# Patient Record
Sex: Male | Born: 1980 | Race: White | Hispanic: No | Marital: Single | Smoking: Never smoker
Health system: Southern US, Community
[De-identification: ages and names within clinical notes are randomized; demographics above are authoritative.]

## PROBLEM LIST (undated history)

## (undated) DIAGNOSIS — J189 Pneumonia, unspecified organism: Secondary | ICD-10-CM

## (undated) HISTORY — PX: WISDOM TOOTH EXTRACTION: SHX21

## (undated) HISTORY — DX: Pneumonia, unspecified organism: J18.9

---
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
## 1999-03-21 HISTORY — PX: WRIST SURGERY: SHX841

## 2011-04-22 ENCOUNTER — Encounter: Payer: Self-pay | Admitting: Emergency Medicine

## 2011-04-22 ENCOUNTER — Emergency Department
Admission: EM | Admit: 2011-04-22 | Discharge: 2011-04-22 | Disposition: A | Discharge status: Home / Self Care | Payer: BC Managed Care – PPO | Source: Home / Self Care | Attending: Family Medicine | Admitting: Family Medicine

## 2011-04-22 DIAGNOSIS — J069 Acute upper respiratory infection, unspecified: Secondary | ICD-10-CM

## 2011-04-22 MED ORDER — AZITHROMYCIN 250 MG PO TABS: ORAL_TABLET | ORAL | Status: AC

## 2011-04-22 NOTE — ED Provider Notes (Signed)
History     CSN: [id number]  Arrival date & time 04/22/11  1504   First MD Initiated Contact with Patient 04/22/11 1631      Chief Complaint  Patient presents with  . Nasal Congestion     HPI Comments: Patient complains of approximately 8 day history of gradually progressive URI symptoms beginning with a mild sore throat (now resolved), followed by progressive nasal congestion.  A cough started about 4 days ago, now resolved.  Complains of fatigue and initial myalgias, resolved.    There has been no pleuritic pain, shortness of breath, or wheezes.  He still has mild nasal congestion and facial pressure.  No recent fevers, chills, and sweats   The history is provided by the patient.    History reviewed. No pertinent past medical history.  Past Surgical History  Procedure Date  . Tonsillectomy   . Appendectomy   . Arthroscopic repair acl     History reviewed. No pertinent family history.  History  Substance Use Topics  . Smoking status: Never Smoker   . Smokeless tobacco: Not on file  . Alcohol Use: Yes      Review of Systems + sore throat resolved + cough, resolved No pleuritic pain No wheezing + nasal congestion + post-nasal drainage ? sinus pain/pressure No itchy/red eyes No earache No hemoptysis No SOB No fever/chills No nausea No vomiting No abdominal pain No diarrhea No urinary symptoms No skin rashes No fatigue No myalgias + headache Used OTC meds without relief  Allergies  Review of patient's allergies indicates no known allergies.  Home Medications   Current Outpatient Rx  Name Route Sig Dispense Refill  . AZITHROMYCIN 250 MG PO TABS  Take 2 tabs today; then begin one tab once daily for 4 more days (Rx void after 04/30/11) 6 each 0    BP 123/80  Pulse 68  Temp 98.8 F (37.1 C)  Resp 18  Ht 5\' 9"  (1.753 m)  Wt 201 lb (91.173 kg)  BMI 29.68 kg/m2  SpO2 98%  Physical Exam Nursing notes and Vital Signs reviewed. Appearance:  Patient  appears healthy, stated age, and in no acute distress Eyes:  Pupils are equal, round, and reactive to light and accomodation.  Extraocular movement is intact.  Conjunctivae are not inflamed  Ears:  Canals normal.  Tympanic membranes normal.  Nose:  Mildly congested turbinates.  No sinus tenderness.     Pharynx:  Normal Neck:  Supple.  Slightly tender shotty posterior nodes are palpated bilaterally  Lungs:  Clear to auscultation.  Breath sounds are equal.  Chest:  Mild tenderness to palpation over the inferior sternum.  Heart:  Regular rate and rhythm without murmurs, rubs, or gallops.  Abdomen:  Nontender without masses or hepatosplenomegaly.  Bowel sounds are present.  No CVA or flank tenderness.  Skin:  No rash present.   ED Course  Procedures none      1. Acute upper respiratory infections of unspecified site       MDM  There is no evidence of bacterial infection today.   Treat symptomatically for now  Take Mucinex D (guaifenesin with decongestant) twice daily for congestion.  Increase fluid intake, rest. May use Afrin nasal spray (or generic oxymetazoline) twice daily for about 5 days.  Also recommend using saline nasal spray several times daily and saline nasal irrigation (AYR is a common brand) Stop all antihistamines for now, and other non-prescription cough/cold preparations. May take Ibuprofen 200mg , 4 tabs every 8  hours with food for chest/sternum discomfort. Begin Azithromycin if not improving about 5 days or if persistent fever develops (given Rx to hold) Follow-up with family doctor if not improving 7 to 10 days.        Donna Christen, MD 04/22/11 [phone]

## 2011-04-22 NOTE — ED Notes (Addendum)
Nasal/sinus congestion, ear pain, rib discomfort, headaches x 8 days. Hx of sinus infx. and this feels similar. Did have Flu vaccination this season.

## 2011-04-26 LAB — CBC AND DIFFERENTIAL
HEMOGLOBIN: 16 g/dL (ref 13.5–17.5)
PLATELETS: 340 10*3/uL (ref 150–399)
WBC: 4.2 10^3/mL

## 2011-04-26 LAB — LIPID PANEL
CHOLESTEROL: 202 mg/dL — AB (ref 0–200)
HDL: 48 mg/dL (ref 35–70)
LDL CALC: 131 mg/dL
TRIGLYCERIDES: 115 mg/dL (ref 40–160)

## 2011-04-26 LAB — HEPATIC FUNCTION PANEL
ALK PHOS: 64 U/L (ref 25–125)
ALT: 24 U/L (ref 10–40)
AST: 15 U/L (ref 14–40)

## 2011-04-26 LAB — BASIC METABOLIC PANEL
CREATININE: 0.9 mg/dL (ref 0.6–1.3)
GLUCOSE: 73 mg/dL
Potassium: 4.6 mmol/L (ref 3.4–5.3)
Sodium: 136 mmol/L — AB (ref 137–147)

## 2011-04-26 LAB — TSH: TSH: 1.4 u[IU]/mL (ref 0.41–5.90)

## 2012-04-26 ENCOUNTER — Emergency Department (INDEPENDENT_AMBULATORY_CARE_PROVIDER_SITE_OTHER): Payer: BC Managed Care – PPO

## 2012-04-26 ENCOUNTER — Encounter: Payer: Self-pay | Admitting: *Deleted

## 2012-04-26 ENCOUNTER — Emergency Department (INDEPENDENT_AMBULATORY_CARE_PROVIDER_SITE_OTHER)
Admission: EM | Admit: 2012-04-26 | Discharge: 2012-04-26 | Disposition: A | Discharge status: Home / Self Care | Payer: BC Managed Care – PPO | Source: Home / Self Care | Attending: Family Medicine | Admitting: Family Medicine

## 2012-04-26 DIAGNOSIS — R05 Cough: Secondary | ICD-10-CM

## 2012-04-26 DIAGNOSIS — R011 Cardiac murmur, unspecified: Secondary | ICD-10-CM

## 2012-04-26 DIAGNOSIS — R0602 Shortness of breath: Secondary | ICD-10-CM

## 2012-04-26 DIAGNOSIS — R059 Cough, unspecified: Secondary | ICD-10-CM

## 2012-04-26 DIAGNOSIS — R918 Other nonspecific abnormal finding of lung field: Secondary | ICD-10-CM

## 2012-04-26 DIAGNOSIS — R062 Wheezing: Secondary | ICD-10-CM

## 2012-04-26 DIAGNOSIS — J189 Pneumonia, unspecified organism: Secondary | ICD-10-CM

## 2012-04-26 DIAGNOSIS — R509 Fever, unspecified: Secondary | ICD-10-CM

## 2012-04-26 LAB — POCT INFLUENZA A/B: Influenza B, POC: NEGATIVE

## 2012-04-26 MED ORDER — AZITHROMYCIN 250 MG PO TABS: ORAL_TABLET | ORAL | Status: DC

## 2012-04-26 MED ORDER — CEFTRIAXONE SODIUM 1 G IJ SOLR
1.0000 g | INTRAMUSCULAR | Status: DC
  Administered 2012-04-26: 1 g via INTRAMUSCULAR

## 2012-04-26 MED ORDER — METHYLPREDNISOLONE ACETATE 40 MG/ML IJ SUSP
80.0000 mg | Freq: Once | INTRAMUSCULAR | Status: AC
  Administered 2012-04-26: 80 mg via INTRAMUSCULAR

## 2012-04-26 MED ORDER — CEFTRIAXONE SODIUM 1 G IJ SOLR
1.0000 g | Freq: Once | INTRAMUSCULAR | Status: AC
  Administered 2012-04-26: 1 g via INTRAMUSCULAR

## 2012-04-26 NOTE — ED Notes (Signed)
Patient c/o body aches and chills x 2 days. Yesterday he developed productive cough, having coughing "fits" that cause SOB. No hx of lung disease.

## 2012-04-26 NOTE — ED Provider Notes (Signed)
History     CSN: [id number]  Arrival date & time 04/26/12  1617   First MD Initiated Contact with Patient 04/26/12 1653      Chief Complaint  Patient presents with  . Chills  . Cough   HPI  URI Symptoms Onset: 2 days  Description: cough, mild SOB, generalized malaise, fever, rhinorrhea, nasal congestion  Modifying factors:  none  Symptoms Nasal discharge: yes Fever: yes Sore throat: no Cough: yes Wheezing: yes Ear pain: no GI symptoms: no Sick contacts: yes  Red Flags  Stiff neck: no Dyspnea: mild Rash: no Swallowing difficulty: no  Sinusitis Risk Factors Headache/face pain: no Double sickening: no tooth pain: no  Allergy Risk Factors Sneezing: no Itchy scratchy throat: no Seasonal symptoms: no  Flu Risk Factors Headache: no muscle aches: yes severe fatigue: yes   History reviewed. No pertinent past medical history.  Past Surgical History  Procedure Date  . Tonsillectomy   . Appendectomy   . Arthroscopic repair acl     History reviewed. No pertinent family history.  History  Substance Use Topics  . Smoking status: Never Smoker   . Smokeless tobacco: Not on file  . Alcohol Use: Yes      Review of Systems  All other systems reviewed and are negative.    Allergies  Molds & smuts  Home Medications  No current outpatient prescriptions on file.  BP 141/81  Pulse 97  Temp 99 F (37.2 C) (Oral)  Resp 14  Ht 5\' 9"  (1.753 m)  Wt 196 lb (88.905 kg)  BMI 28.94 kg/m2  SpO2 96%  Physical Exam  Constitutional: He appears well-developed and well-nourished.  HENT:  Head: Normocephalic and atraumatic.       +nasal erythema, rhinorrhea bilaterally, + post oropharyngeal erythema    Eyes: Conjunctivae normal are normal. Pupils are equal, round, and reactive to light.  Neck: Normal range of motion. Neck supple.  Cardiovascular: Normal rate and regular rhythm.        + II-III/VI systolic murmur most prominent around cardiac outflow tract    Pulmonary/Chest: Effort normal.       Faint wheezes in bases    Abdominal: Soft.  Musculoskeletal: Normal range of motion.  Neurological: He is alert.  Skin: Skin is warm.    ED Course  Procedures (including critical care time)   Labs Reviewed  POCT INFLUENZA A/B   Dg Chest 2 View  04/26/2012  *RADIOLOGY REPORT*  Clinical Data: Cough, shortness of breath, fever  CHEST - 2 VIEW  Comparison: None.  Findings: There is a patchy opacity within the right middle lung field probably within the right upper lobe suspicious for pneumonia.  The remainder of the lungs are clear.  No effusion is seen.  No skeletal abnormality is seen.  IMPRESSION: Patchy opacity in the region of the posterior right upper lobe most consistent with pneumonia.  Recommend follow-up.   Original Report Authenticated By: Dwyane Dee, M.D.      1. Pneumonia   2. Wheezing   3. Systolic murmur    EKG: normal EKG, normal sinus rhythm.    MDM  Noted pneumonia on x-ray. Will treat with azithromycin. 1 g of Rocephin IM x1 here in clinic. Depo-Medrol 80 mg IM x1 for wheezing. EKG was normal today in the setting of systolic murmur. Will formally refer patient to cardiology for further evaluation of this. Patient denies any prior history of murmur in the past. Currently asymptomatic apart from pulmonary issues related to  pneumonia. Discussed general and cardiopulmonary red flags. Followup with PCP in the next 5-7 days for reevaluation of symptoms.  `  The patient and/or caregiver has been counseled thoroughly with regard to treatment plan and/or medications prescribed including dosage, schedule, interactions, rationale for use, and possible side effects and they verbalize understanding. Diagnoses and expected course of recovery discussed and will return if not improved as expected or if the condition worsens. Patient and/or caregiver verbalized understanding.             Doree Albee, MD 04/26/12 1806

## 2012-04-29 ENCOUNTER — Telehealth: Payer: Self-pay | Admitting: *Deleted

## 2012-04-30 ENCOUNTER — Encounter: Payer: Self-pay | Admitting: Cardiology

## 2012-04-30 ENCOUNTER — Encounter: Payer: Self-pay | Admitting: *Deleted

## 2012-05-01 ENCOUNTER — Encounter: Payer: Self-pay | Admitting: Cardiology

## 2012-05-01 ENCOUNTER — Ambulatory Visit (INDEPENDENT_AMBULATORY_CARE_PROVIDER_SITE_OTHER): Payer: BC Managed Care – PPO | Admitting: Cardiology

## 2012-05-01 VITALS — BP 130/84 | HR 90 | Wt 199.0 lb

## 2012-05-01 DIAGNOSIS — J189 Pneumonia, unspecified organism: Secondary | ICD-10-CM

## 2012-05-01 DIAGNOSIS — R011 Cardiac murmur, unspecified: Secondary | ICD-10-CM

## 2012-05-01 NOTE — Assessment & Plan Note (Signed)
I do not hear a murmur on examination. His PMI is not displaced and there is no right ventricular heave. There is no change with Valsalva. I will not pursue further cardiac evaluation at this point. Previous murmur may have been related to an ejection murmur with his pneumonia and probable tachycardia.

## 2012-05-01 NOTE — Patient Instructions (Addendum)
Your physician recommends that you schedule a follow-up appointment in: AS NEEDED  

## 2012-05-01 NOTE — Progress Notes (Signed)
  HPI: 32 year old male for evaluation of murmur. Patient recently seen for pneumonia. During that evaluation he was felt to have a murmur and cardiology was asked to evaluate. He denies dyspnea, chest pain, palpitations or syncope. His pneumonia symptoms have resolved.  Current Outpatient Prescriptions  Medication Sig Dispense Refill  . aspirin 81 MG tablet Take 81 mg by mouth as needed for pain.       No current facility-administered medications for this visit.    Allergies  Allergen Reactions  . Molds & Smuts     Past Medical History  Diagnosis Date  . Pneumonia     Past Surgical History  Procedure Laterality Date  . Tonsillectomy    . Appendectomy    . Arthroscopic repair acl    . Wrist surgery      History   Social History  . Marital Status: Single    Spouse Name: N/A    Number of Children: N/A  . Years of Education: N/A   Occupational History  . Not on file.   Social History Main Topics  . Smoking status: Never Smoker   . Smokeless tobacco: Not on file  . Alcohol Use: Yes     Comment: Occasion  . Drug Use: No  . Sexually Active: Not on file   Other Topics Concern  . Not on file   Social History Narrative  . No narrative on file    Family History  Problem Relation Age of Onset  . Heart disease      No history in immediate family    ROS: no fevers or chills, productive cough, hemoptysis, dysphasia, odynophagia, melena, hematochezia, dysuria, hematuria, rash, seizure activity, orthopnea, PND, pedal edema, claudication. Remaining systems are negative.  Physical Exam:   Blood pressure 130/84, pulse 90, weight 199 lb (90.266 kg).  General:  Well developed/well nourished in NAD Skin warm/dry Patient not depressed No peripheral clubbing Back-normal HEENT-normal/normal eyelids Neck supple/normal carotid upstroke bilaterally; no bruits; no JVD; no thyromegaly chest - CTA/ normal expansion CV - RRR/normal S1 and S2; no murmurs, rubs or gallops;   PMI nondisplaced Abdomen -NT/ND, no HSM, no mass, + bowel sounds, no bruit 2+ femoral pulses, no bruits Ext-no edema, chords, 2+ DP Neuro-grossly nonfocal  ECG 04/26/2012-sinus rhythm, RV conduction delay.

## 2012-05-01 NOTE — Assessment & Plan Note (Signed)
Symptoms improved. Followup primary care.

## 2013-05-19 ENCOUNTER — Ambulatory Visit (INDEPENDENT_AMBULATORY_CARE_PROVIDER_SITE_OTHER): Payer: BC Managed Care – PPO | Admitting: Family Medicine

## 2013-05-19 ENCOUNTER — Encounter: Payer: Self-pay | Admitting: Family Medicine

## 2013-05-19 VITALS — BP 129/84 | HR 68 | Ht 69.0 in | Wt 203.0 lb

## 2013-05-19 DIAGNOSIS — R0982 Postnasal drip: Secondary | ICD-10-CM

## 2013-05-19 DIAGNOSIS — Z113 Encounter for screening for infections with a predominantly sexual mode of transmission: Secondary | ICD-10-CM

## 2013-05-19 MED ORDER — MOMETASONE FUROATE 50 MCG/ACT NA SUSP: NASAL | Status: AC

## 2013-05-19 NOTE — Progress Notes (Signed)
CC: Kenneth Stokes is a 33 y.o. male is here for Establish Care and std screen   Subjective: HPI:  Very pleasant 33 year old here to establish care  Patient is requesting counseling on STD screening he has had multiple partners with unprotected sex in the past he is marrying a virgin wife in September of this year. He and she are expressing no concerns with symptoms related to STDs however want to make sure that Neither one of them have any contagious diseases. Specifically denies testicular pain, dysuria, penile discharge nor genital lesions recently or remotely.  Complains of 2 months of a nonproductive cough present all hours of the day but worse when lying down at night or when exposed to cold air. This was initially accompanied by nasal congestion and facial pressure however this is resolved with Sudafed many weeks ago.  Taking vitamin C supplements but no benefit.  Denies chest pain, shortness of breath, wheezing, orthopnea nor peripheral edema  Review of Systems - General ROS: negative for - chills, fever, night sweats, weight gain or weight loss Ophthalmic ROS: negative for - decreased vision Psychological ROS: negative for - anxiety or depression ENT ROS: negative for - hearing change, nasal congestion, tinnitus or allergies Hematological and Lymphatic ROS: negative for - bleeding problems, bruising or swollen lymph nodes Breast ROS: negative Respiratory ROS: no shortness of breath, or wheezing Cardiovascular ROS: no chest pain or dyspnea on exertion Gastrointestinal ROS: no abdominal pain, change in bowel habits, or black or bloody stools Genito-Urinary ROS: negative for - genital discharge, genital ulcers, incontinence or abnormal bleeding from genitals Musculoskeletal ROS: negative for - joint pain or muscle pain Neurological ROS: negative for - headaches or memory loss Dermatological ROS: negative for lumps, mole changes, rash and skin lesion changes  Past Medical History   Diagnosis Date  . Pneumonia     Past Surgical History  Procedure Laterality Date  . Tonsillectomy  1992  . Appendectomy  1998  . Arthroscopic repair acl  1997  . Wrist surgery  2001  . Wisdom tooth extraction     Family History  Problem Relation Age of Onset  . Heart disease      No history in immediate family    History   Social History  . Marital Status: Single    Spouse Name: N/A    Number of Children: N/A  . Years of Education: N/A   Occupational History  . Not on file.   Social History Main Topics  . Smoking status: Never Smoker   . Smokeless tobacco: Not on file  . Alcohol Use: Yes     Comment: Occasion  . Drug Use: No  . Sexual Activity: Yes    Partners: Female   Other Topics Concern  . Not on file   Social History Narrative  . No narrative on file     Objective: BP 129/84  Pulse 68  Ht 5\' 9"  (1.753 m)  Wt 203 lb (92.08 kg)  BMI 29.96 kg/m2  General: Alert and Oriented, No Acute Distress HEENT: Pupils equal, round, reactive to light. Conjunctivae clear.  External ears unremarkable, canals clear with intact TMs with appropriate landmarks.  Middle ear appears open without effusion. Pink inferior turbinates.  Moist mucous membranes, pharynx without inflammation nor lesions however moderate cobblestoning.  Neck supple without palpable lymphadenopathy nor abnormal masses. Lungs: Clear to auscultation bilaterally, no wheezing/ronchi/rales.  Comfortable work of breathing. Good air movement. Cardiac: Regular rate and rhythm. Normal S1/S2.  No murmurs,  rubs, nor gallops.   Mental Status: No depression, anxiety, nor agitation. Skin: Warm and dry.  Assessment & Plan: Kenneth Stokes was seen today for establish care and std screen.  Diagnoses and associated orders for this visit:  Screening for STD (sexually transmitted disease) - HIV antibody - RPR - GC/chlamydia probe amp, urine - Hepatitis B surface antigen - Hepatitis C antibody - HSV 1 antibody,  IgG - HSV 2 antibody, IgG  Post-nasal drip - mometasone (NASONEX) 50 MCG/ACT nasal spray; Two sprays each nostril daily.    STD screening: Checking above labs Postnasal drip: Suspect this is causing his persistent cough therefore start Nasonex if cheaper he could also consider using Nasacort over-the-counter   Return if symptoms worsen or fail to improve.

## 2013-05-20 ENCOUNTER — Encounter: Payer: Self-pay | Admitting: Family Medicine

## 2013-05-20 DIAGNOSIS — B009 Herpesviral infection, unspecified: Secondary | ICD-10-CM | POA: Insufficient documentation

## 2013-05-20 LAB — HEPATITIS C ANTIBODY: HCV AB: NEGATIVE

## 2013-05-20 LAB — GC/CHLAMYDIA PROBE AMP, URINE
Chlamydia, Swab/Urine, PCR: NEGATIVE
GC Probe Amp, Urine: NEGATIVE

## 2013-05-20 LAB — HSV 2 ANTIBODY, IGG: HSV 2 GLYCOPROTEIN G AB, IGG: 0.75 IV

## 2013-05-20 LAB — RPR

## 2013-05-20 LAB — HIV ANTIBODY (ROUTINE TESTING W REFLEX): HIV: NONREACTIVE

## 2013-05-20 LAB — HSV 1 ANTIBODY, IGG: HSV 1 Glycoprotein G Ab, IgG: 7.06 IV — ABNORMAL HIGH

## 2013-05-20 LAB — HEPATITIS B SURFACE ANTIGEN: HEP B S AG: NEGATIVE

## 2013-06-05 ENCOUNTER — Encounter: Payer: Self-pay | Admitting: Family Medicine

## 2013-06-05 DIAGNOSIS — M75101 Unspecified rotator cuff tear or rupture of right shoulder, not specified as traumatic: Secondary | ICD-10-CM | POA: Insufficient documentation

## 2013-06-05 DIAGNOSIS — E785 Hyperlipidemia, unspecified: Secondary | ICD-10-CM | POA: Insufficient documentation

## 2013-06-10 ENCOUNTER — Encounter: Payer: Self-pay | Admitting: *Deleted

## 2014-02-21 IMAGING — CR DG CHEST 2V
2 series · 2 of 2 positions shown · non-contrast
Comparison: None.

CLINICAL DATA: Cough, shortness of breath, fever

CHEST - 2 VIEW

[view not recorded (1 of 2)]
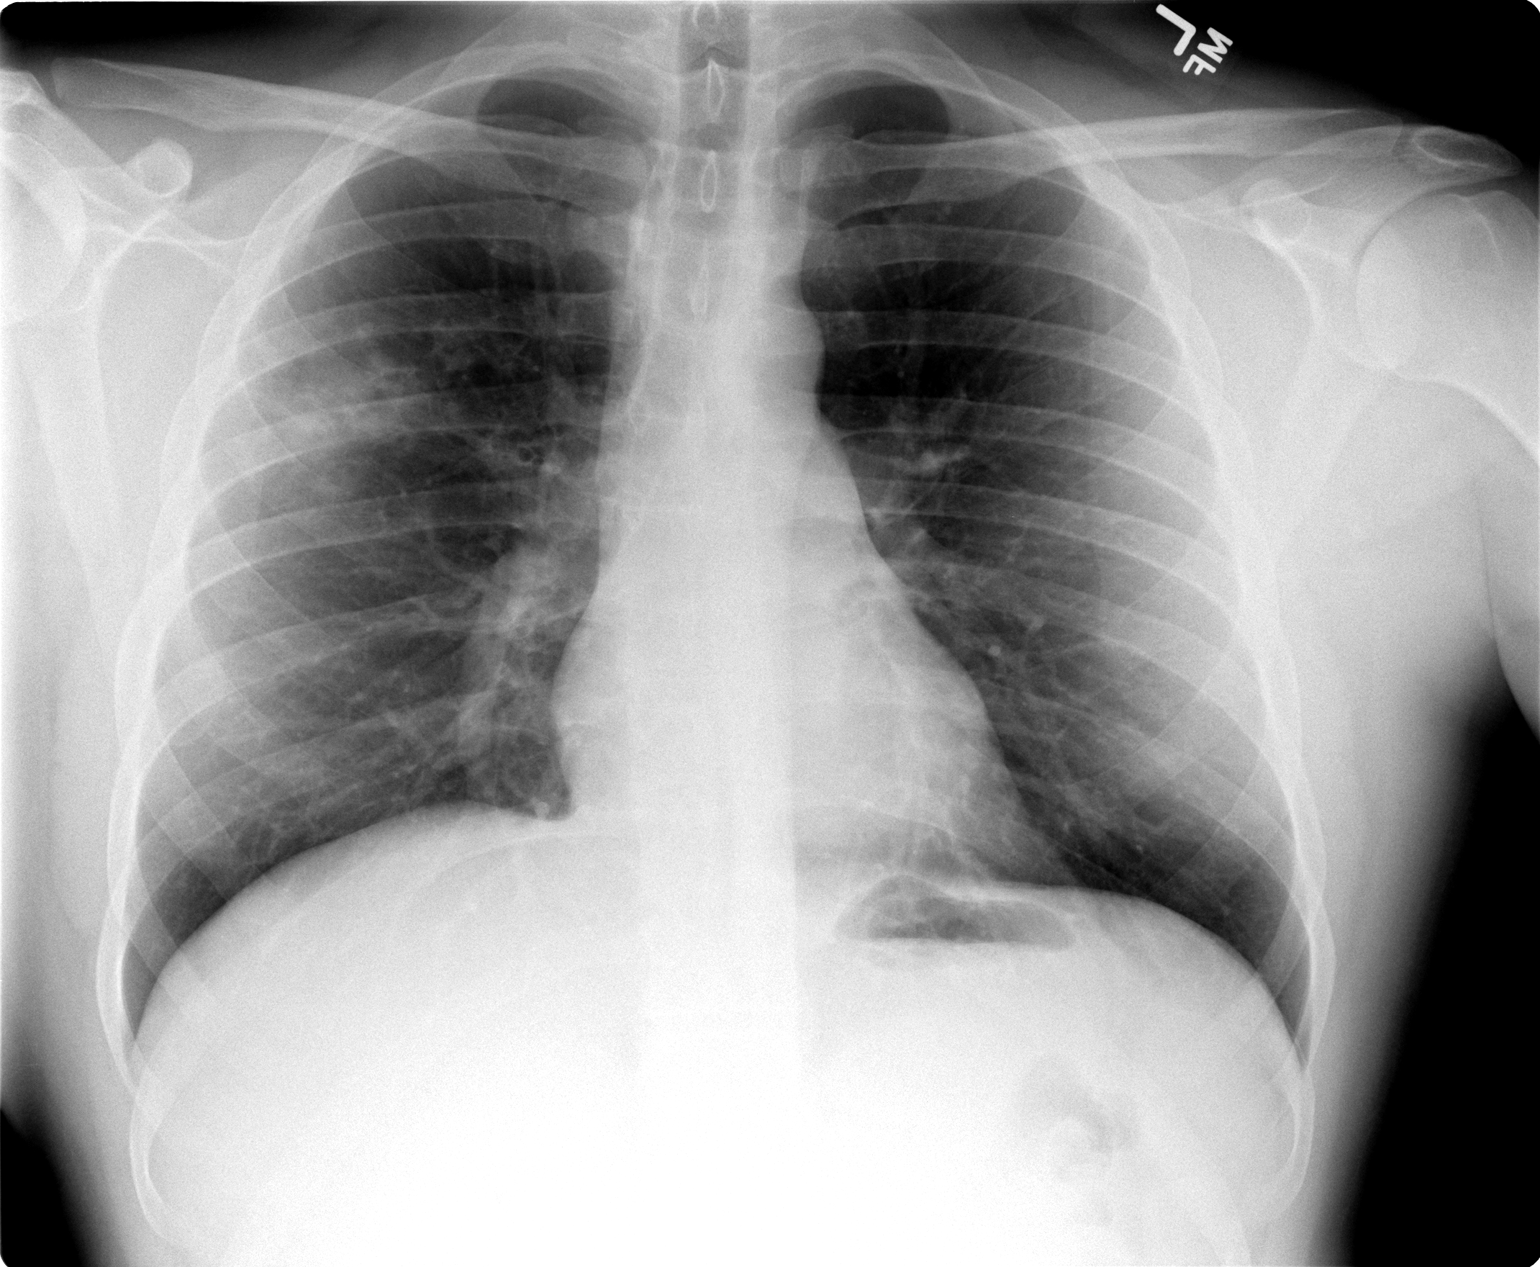

[view not recorded (2 of 2)]
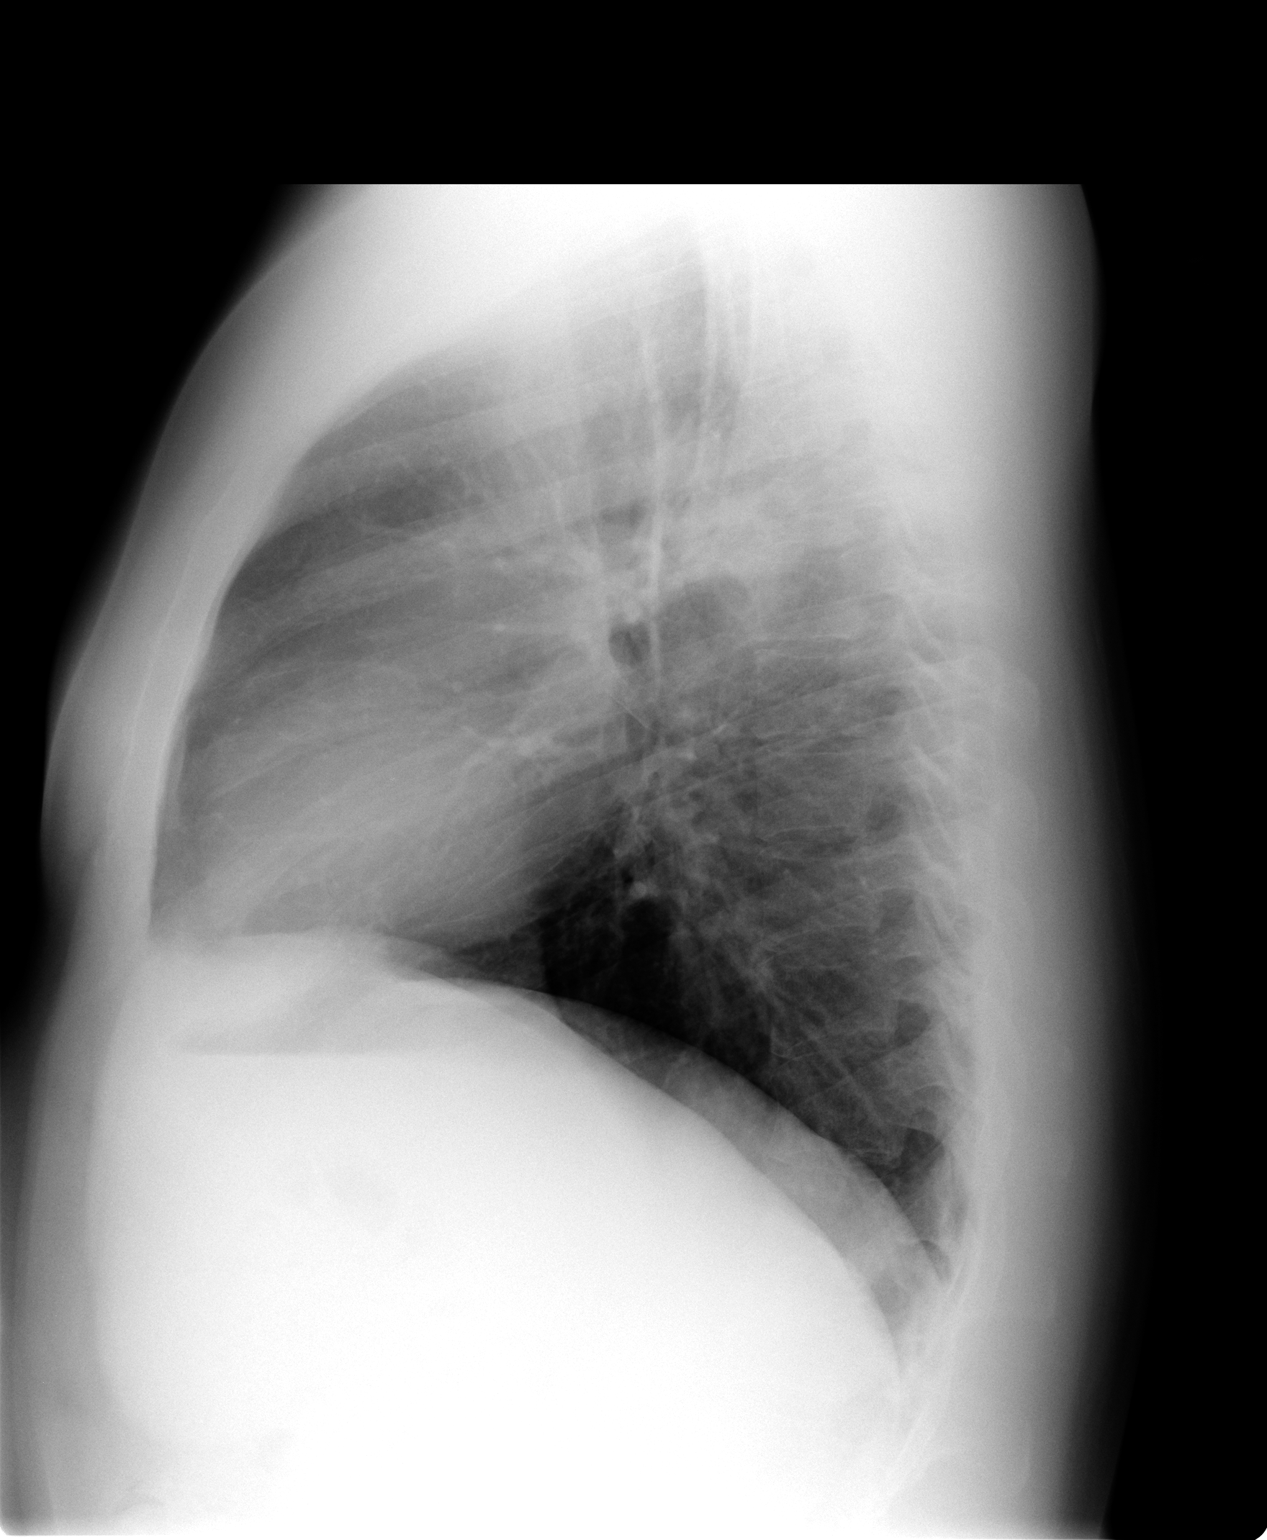

[2 of 2 positions shown; findings below may reference images not displayed]

FINDINGS: There is a patchy opacity within the right middle lung
field probably within the right upper lobe suspicious for
pneumonia.  The remainder of the lungs are clear.  No effusion is
seen.  No skeletal abnormality is seen.
IMPRESSION: Patchy opacity in the region of the posterior right upper lobe most
consistent with pneumonia.  Recommend follow-up.
